# Patient Record
Sex: Male | Born: 1995 | Race: Black or African American | Hispanic: No | Marital: Single | Smoking: Never smoker
Health system: Southern US, Community
[De-identification: ages and names within clinical notes are randomized; demographics above are authoritative.]

---
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
## 2017-07-26 ENCOUNTER — Encounter (HOSPITAL_COMMUNITY): Payer: Self-pay | Admitting: Emergency Medicine

## 2017-07-26 ENCOUNTER — Emergency Department (HOSPITAL_COMMUNITY): Payer: Self-pay

## 2017-07-26 ENCOUNTER — Emergency Department (HOSPITAL_COMMUNITY)
Admission: EM | Admit: 2017-07-26 | Discharge: 2017-07-26 | Disposition: A | Discharge status: Home / Self Care | Payer: PRIVATE HEALTH INSURANCE | Attending: Emergency Medicine | Admitting: Emergency Medicine

## 2017-07-26 DIAGNOSIS — Y999 Unspecified external cause status: Secondary | ICD-10-CM | POA: Insufficient documentation

## 2017-07-26 DIAGNOSIS — Y929 Unspecified place or not applicable: Secondary | ICD-10-CM | POA: Insufficient documentation

## 2017-07-26 DIAGNOSIS — W230XXA Caught, crushed, jammed, or pinched between moving objects, initial encounter: Secondary | ICD-10-CM | POA: Insufficient documentation

## 2017-07-26 DIAGNOSIS — S93402A Sprain of unspecified ligament of left ankle, initial encounter: Secondary | ICD-10-CM

## 2017-07-26 DIAGNOSIS — Y9389 Activity, other specified: Secondary | ICD-10-CM | POA: Insufficient documentation

## 2017-07-26 DIAGNOSIS — Z79899 Other long term (current) drug therapy: Secondary | ICD-10-CM | POA: Insufficient documentation

## 2017-07-26 DIAGNOSIS — J45909 Unspecified asthma, uncomplicated: Secondary | ICD-10-CM | POA: Insufficient documentation

## 2017-07-26 MED ORDER — BACITRACIN ZINC 500 UNIT/GM EX OINT
1.0000 "application " | TOPICAL_OINTMENT | Freq: Two times a day (BID) | CUTANEOUS | Status: DC
  Administered 2017-07-26: 1 via TOPICAL
  Filled 2017-07-26: qty 0.9

## 2017-07-26 MED ORDER — NAPROXEN 375 MG PO TABS: 375.0000 mg | ORAL_TABLET | Freq: Two times a day (BID) | ORAL | 0 refills | Status: DC

## 2017-07-26 MED ORDER — ACETAMINOPHEN 325 MG PO TABS
650.0000 mg | ORAL_TABLET | Freq: Once | ORAL | Status: AC
  Administered 2017-07-26: 650 mg via ORAL
  Filled 2017-07-26: qty 2

## 2017-07-26 MED ORDER — BACITRACIN ZINC 500 UNIT/GM EX OINT: 1.0000 "application " | TOPICAL_OINTMENT | Freq: Two times a day (BID) | CUTANEOUS | 0 refills | Status: DC

## 2017-07-26 NOTE — ED Provider Notes (Signed)
[facility] EMERGENCY DEPARTMENT Provider Note   CSN: [id number] Arrival date & time: 07/26/17  4098     History   Chief Complaint Chief Complaint  Patient presents with  . Ankle Injury    HPI Barry Brown is a 21 y.o. male.  Barry Brown is a 21 y.o. Male who presents to the ED complaining of left ankle pain after injury prior to arrival.  Patient reports he was working on a car when it went out of gear and ran over his left ankle.  He complains of pain to the medial and lateral aspect of his left ankle. He reports the tire rolled over his ankle. Only one tire went over his ankle. Concrete beneath. No treatments prior to arrival. He denies numbness, tingling or weakness. No left foot, hip, knee or shin pain.  He denies other injury.    The history is provided by the patient, medical records and a parent. No language interpreter was used.  Ankle Injury     Past Medical History:  Diagnosis Date  . Asthma     There are no active problems to display for this patient.   History reviewed. No pertinent surgical history.     Home Medications    Prior to Admission medications   Medication Sig Start Date End Date Taking? Authorizing Provider  albuterol (PROVENTIL HFA;VENTOLIN HFA) 108 (90 BASE) MCG/ACT inhaler Inhale 2 puffs into the lungs every 6 (six) hours as needed. Patient used his inhaler on last night.    [provider]  aspirin-sod bicarb-citric acid (ALKA-SELTZER) 325 MG TBEF Take 325 mg by mouth every 6 (six) hours as needed. Patient used this medication for sinus and congestion.    [provider]  bacitracin ointment Apply 1 application topically 2 (two) times daily. Apply to abrasion. 07/26/17   Everlene Farrier, PA-C  methylphenidate (CONCERTA) 36 MG CR tablet Take 36 mg by mouth every morning.    [provider]  montelukast (SINGULAIR) 10 MG tablet Take 10 mg by mouth at bedtime.    [provider]    Multiple Vitamin (MULTIVITAMIN) tablet Take 1 tablet by mouth daily.    [provider]  naproxen (NAPROSYN) 375 MG tablet Take 1 tablet (375 mg total) by mouth 2 (two) times daily with a meal. 07/26/17   Everlene Farrier, PA-C    Family History No family history on file.  Social History Social History   Tobacco Use  . Smoking status: Never Smoker  Substance Use Topics  . Alcohol use: No  . Drug use: Not on file     Allergies   Sulfa antibiotics   Review of Systems Review of Systems  Constitutional: Negative for fever.  Musculoskeletal: Positive for arthralgias. Negative for joint swelling.  Skin: Positive for wound. Negative for color change.  Neurological: Negative for weakness and numbness.     Physical Exam Updated Vital Signs BP (!) 158/92 (BP Location: Right Arm)   Pulse 79   Temp 98.7 F (37.1 C) (Oral)   Resp 18   SpO2 98%   Physical Exam  Constitutional: He appears well-developed and well-nourished. No distress.  HENT:  Head: Normocephalic and atraumatic.  Eyes: Right eye exhibits no discharge. Left eye exhibits no discharge.  Cardiovascular: Normal rate, regular rhythm and intact distal pulses.  Bilateral dorsalis pedis and posterior tibialis pulses are intact.  Pulmonary/Chest: Effort normal. No respiratory distress.  Musculoskeletal: Normal range of motion. He exhibits tenderness. He exhibits no edema  or deformity.  Tenderness to palpation to the medial and lateral aspects of his left ankle.  Superficial less than 1 cm sized abrasion to the lateral aspect of his left ankle.  No ankle edema or laxity noted.  No tenderness noted to his left foot.  Leg compartments feel soft.  No tenderness to his left knee, hip or foot.  Neurological: He is alert. No sensory deficit. Coordination normal.  Skin: Skin is warm and dry. Capillary refill takes less than 2 seconds. No rash noted. He is not diaphoretic. No erythema. No pallor.  Psychiatric: He has a  normal mood and affect. His behavior is normal.  Nursing note and vitals reviewed.    ED Treatments / Results  Labs (all labs ordered are listed, but only abnormal results are displayed) Labs Reviewed - No data to display  EKG  EKG Interpretation None       Radiology Dg Ankle Complete Left  Result Date: 07/26/2017 CLINICAL DATA:  Injury to the left ankle EXAM: LEFT ANKLE COMPLETE - 3+ VIEW COMPARISON:  None. FINDINGS: There is no evidence of fracture, dislocation, or joint effusion. There is no evidence of arthropathy or other focal bone abnormality. Soft tissues are unremarkable. IMPRESSION: Negative. Electronically Signed   By: Jasmine PangKim  Fujinaga M.D.   On: 07/26/2017 19:50    Procedures Procedures (including critical care time)  Medications Ordered in ED Medications  bacitracin ointment 1 application (not administered)  acetaminophen (TYLENOL) tablet 650 mg (not administered)     Initial Impression / Assessment and Plan / ED Course  I have reviewed the triage vital signs and the nursing notes.  Pertinent labs & imaging results that were available during my care of the patient were reviewed by me and considered in my medical decision making (see chart for details).    This is a 21 y.o. Male who presents to the ED complaining of left ankle pain after injury prior to arrival.  Patient reports he was working on a car when it went out of gear and ran over his left ankle.  He complains of pain to the medial and lateral aspect of his left ankle. He reports the tire rolled over his ankle. Only one tire went over his ankle. Concrete beneath. No treatments prior to arrival. He denies numbness, tingling or weakness. No left foot, hip, knee or shin pain. On exam the patient is afebrile and nontoxic-appearing.  He has tenderness to the medial and lateral aspect of his left ankle.  No ankle instability noted.  He is neurovascularly intact.  Leg compartments feel soft.  No other tenderness to  palpation.  No deformity.  X-ray is unremarkable.  We will place the patient in an ASO ankle brace after bacitracin and wound care applied.  I advised to have non-weight-bear until he is feeling better.  I advised if he has persistent pain he needs follow-up with orthopedic surgery.  Patient provided Driscilla GrammesOrth O follow-up.  I discussed using rest, ice and elevation for his pain.  Naproxen for pain control.  Return precautions discussed. I advised the patient to follow-up with their primary care provider this week. I advised the patient to return to the emergency department with new or worsening symptoms or new concerns. The patient verbalized understanding and agreement with plan.      Final Clinical Impressions(s) / ED Diagnoses   Final diagnoses:  Sprain of left ankle, unspecified ligament, initial encounter    ED Discharge Orders  Ordered    naproxen (NAPROSYN) 375 MG tablet  2 times daily with meals     07/26/17 2047    bacitracin ointment  2 times daily     07/26/17 2047       Everlene FarrierDansie, Mathew Storck, PA-C 07/26/17 2104    Derwood KaplanNanavati, Ankit, MD 07/27/17 2320
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
## 2017-10-14 ENCOUNTER — Ambulatory Visit (HOSPITAL_COMMUNITY)
Admission: EM | Admit: 2017-10-14 | Discharge: 2017-10-14 | Disposition: A | Discharge status: Home / Self Care | Payer: BC Managed Care – PPO | Attending: Family Medicine | Admitting: Family Medicine

## 2017-10-14 ENCOUNTER — Ambulatory Visit (INDEPENDENT_AMBULATORY_CARE_PROVIDER_SITE_OTHER): Payer: BC Managed Care – PPO

## 2017-10-14 ENCOUNTER — Encounter (HOSPITAL_COMMUNITY): Payer: Self-pay | Admitting: Family Medicine

## 2017-10-14 DIAGNOSIS — S93412A Sprain of calcaneofibular ligament of left ankle, initial encounter: Secondary | ICD-10-CM | POA: Diagnosis not present

## 2017-10-14 NOTE — Discharge Instructions (Signed)
Do the stretches and exercises for a total of 3 sets, 3 times per week.   Ice/cold pack over area for 10-15 min twice daily.  Ibuprofen 400-600 mg (2-3 over the counter strength tabs) every 6 hours as needed for pain.  OK to take Tylenol 1000 mg (2 extra strength tabs) or 975 mg (3 regular strength tabs) every 6 hours as needed.

## 2017-10-14 NOTE — ED Provider Notes (Signed)
  [facility]    CSN: [id number] Arrival date & time: 10/14/17  1724  Musculoskeletal Exam  Patient: Barry Brown DOB: 05/05/1996  DOS: 10/14/2017  SUBJECTIVE:  Chief Complaint:   Chief Complaint  Patient presents with  . Ankle Injury    Barry Brown is a 22 y.o.  male for evaluation and treatment of L ankle pain.   Onset:  1 day ago.  Inverted ankle yesterday while working on car.  Location: Lateral ankle Character:  aching, sharp and stabbing  Progression of issue:  is unchanged Associated symptoms: swelling Treatment: to date has been wearing a brace at home.   Neurovascular symptoms: no  ROS: Musculoskeletal/Extremities: +L ankle pain  Past Medical History:  Diagnosis Date  . Asthma     Objective: VITAL SIGNS: BP 138/78   Pulse 77   Temp 98.1 F (36.7 C)   Resp 18   SpO2 99%  Constitutional: Well formed, well developed. No acute distress. Cardiovascular: Brisk cap refill Thorax & Lungs: No accessory muscle use Musculoskeletal: L ankle.   Normal active range of motion: no.   Normal passive range of motion: no, limited by pain Tenderness to palpation: yes, over base of 5th MT and posterior portion of lat malleolus Deformity: swelling noted postero-laterally Ecchymosis: no Tests positive: none Tests negative: squeeze, anterior drawer Neurologic: Normal sensory function. Psychiatric: Normal mood. Age appropriate judgment and insight. Alert & oriented x 3.    Assessment:  Sprain of calcaneofibular ligament of left ankle, initial encounter  Plan: XR neg.  Offered crutches but he declined. Ice, NSAIDs, Tylenol, stretches/exercises. Letter for work given stating he can return tomorrow. F/u w pcp later this week if no improvement. The patient voiced understanding and agreement to the plan.    Sharlene DoryWendling, Nicholas Paul, DO 10/14/17 1850

## 2017-10-14 NOTE — ED Triage Notes (Signed)
Pt here for left ankle pain after twisting it yesterday. Ankle brace in place.

## 2018-10-04 ENCOUNTER — Encounter: Payer: Self-pay | Admitting: Podiatry

## 2018-10-08 ENCOUNTER — Ambulatory Visit: Payer: Self-pay | Admitting: Podiatry

## 2018-11-24 IMAGING — DX DG ANKLE COMPLETE 3+V*L*
3 series · 3 of 3 positions shown · non-contrast
Comparison: None.

CLINICAL DATA: Injury to the left ankle

EXAM:
LEFT ANKLE COMPLETE - 3+ VIEW

[ankle ap]
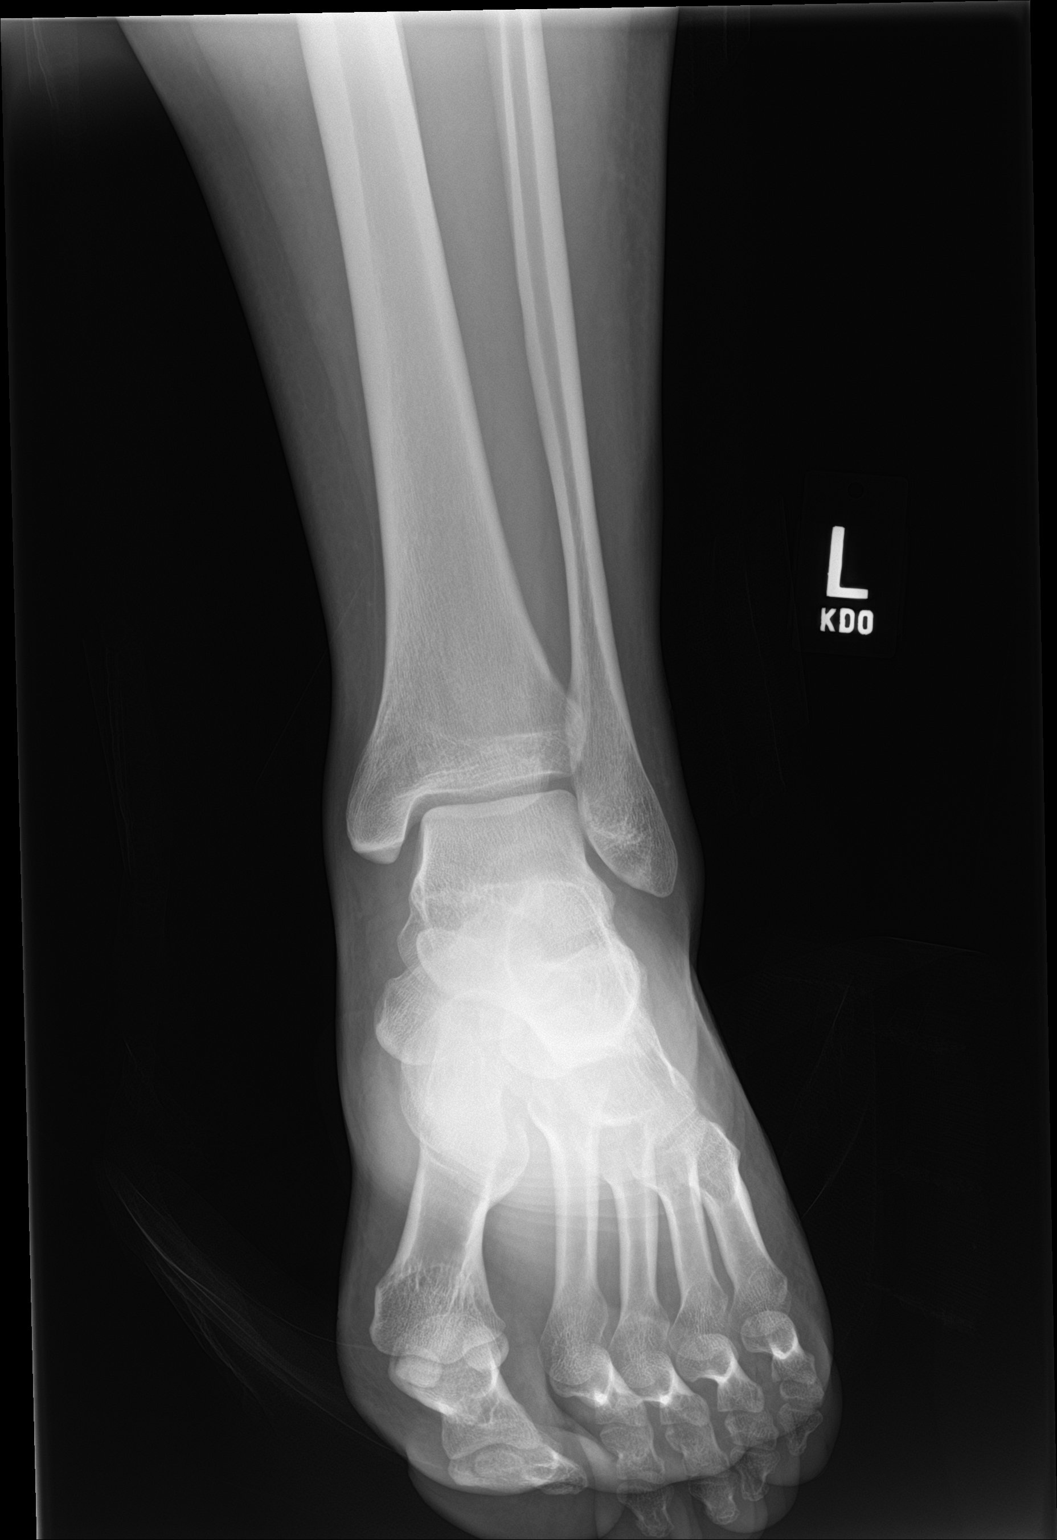

[ankle obl]
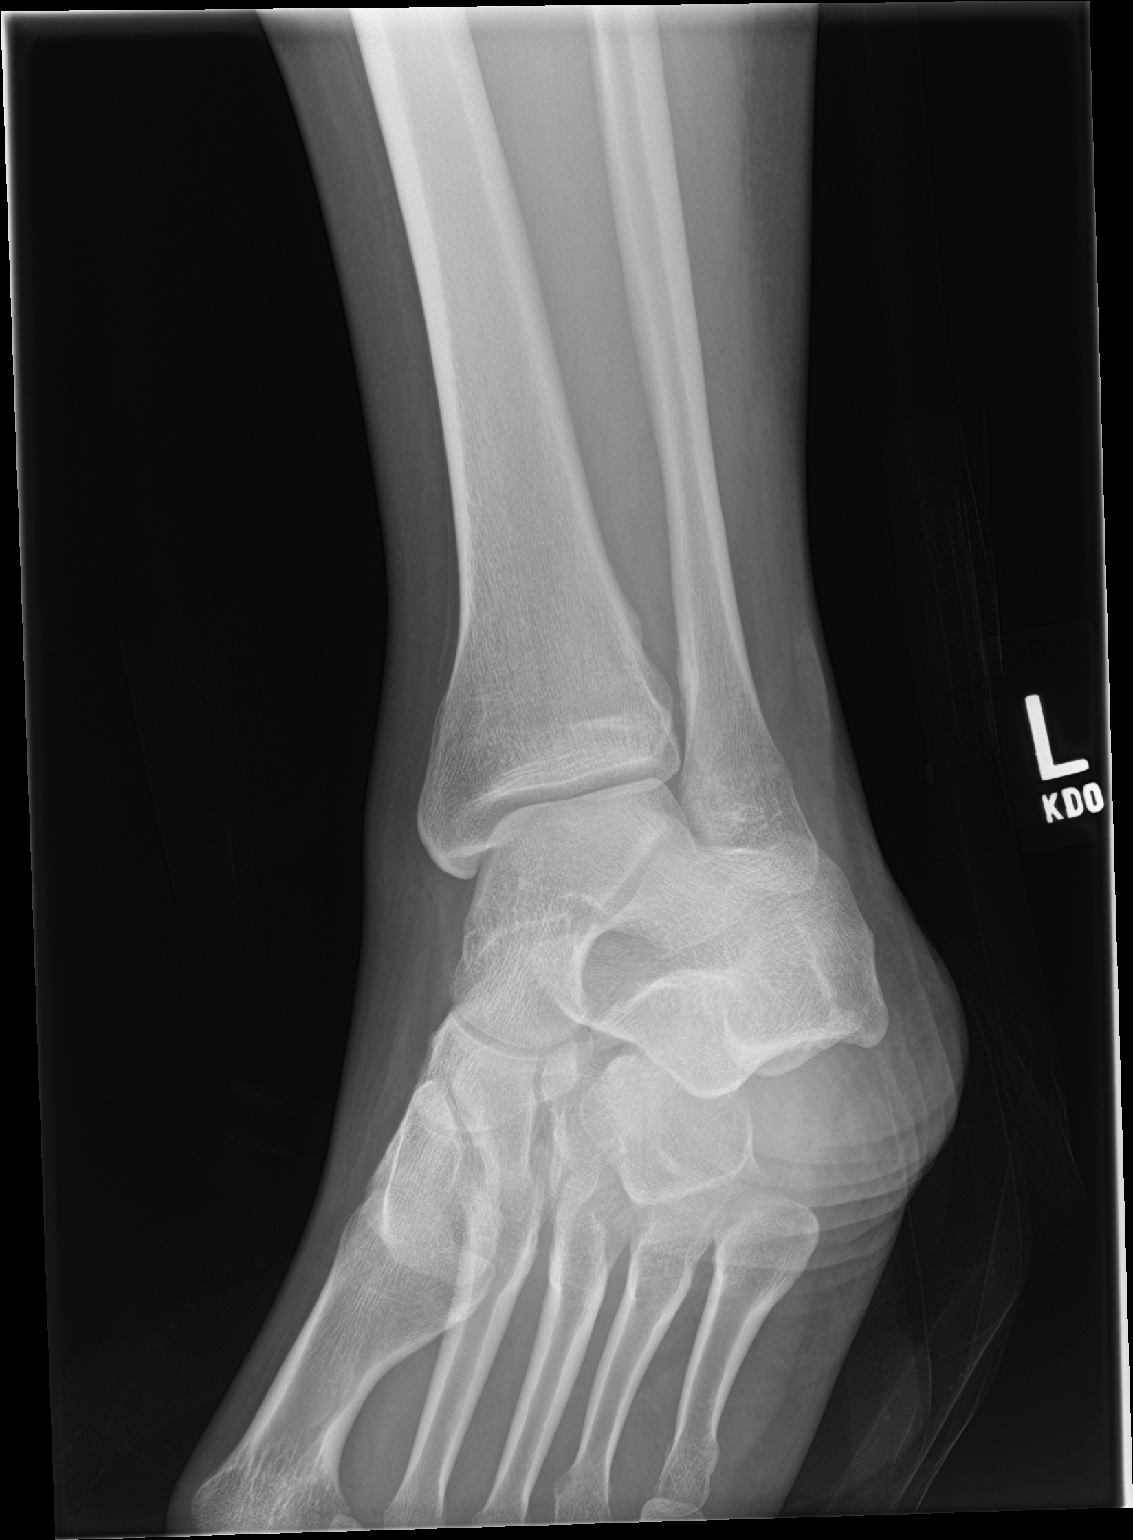

[ankle lat]
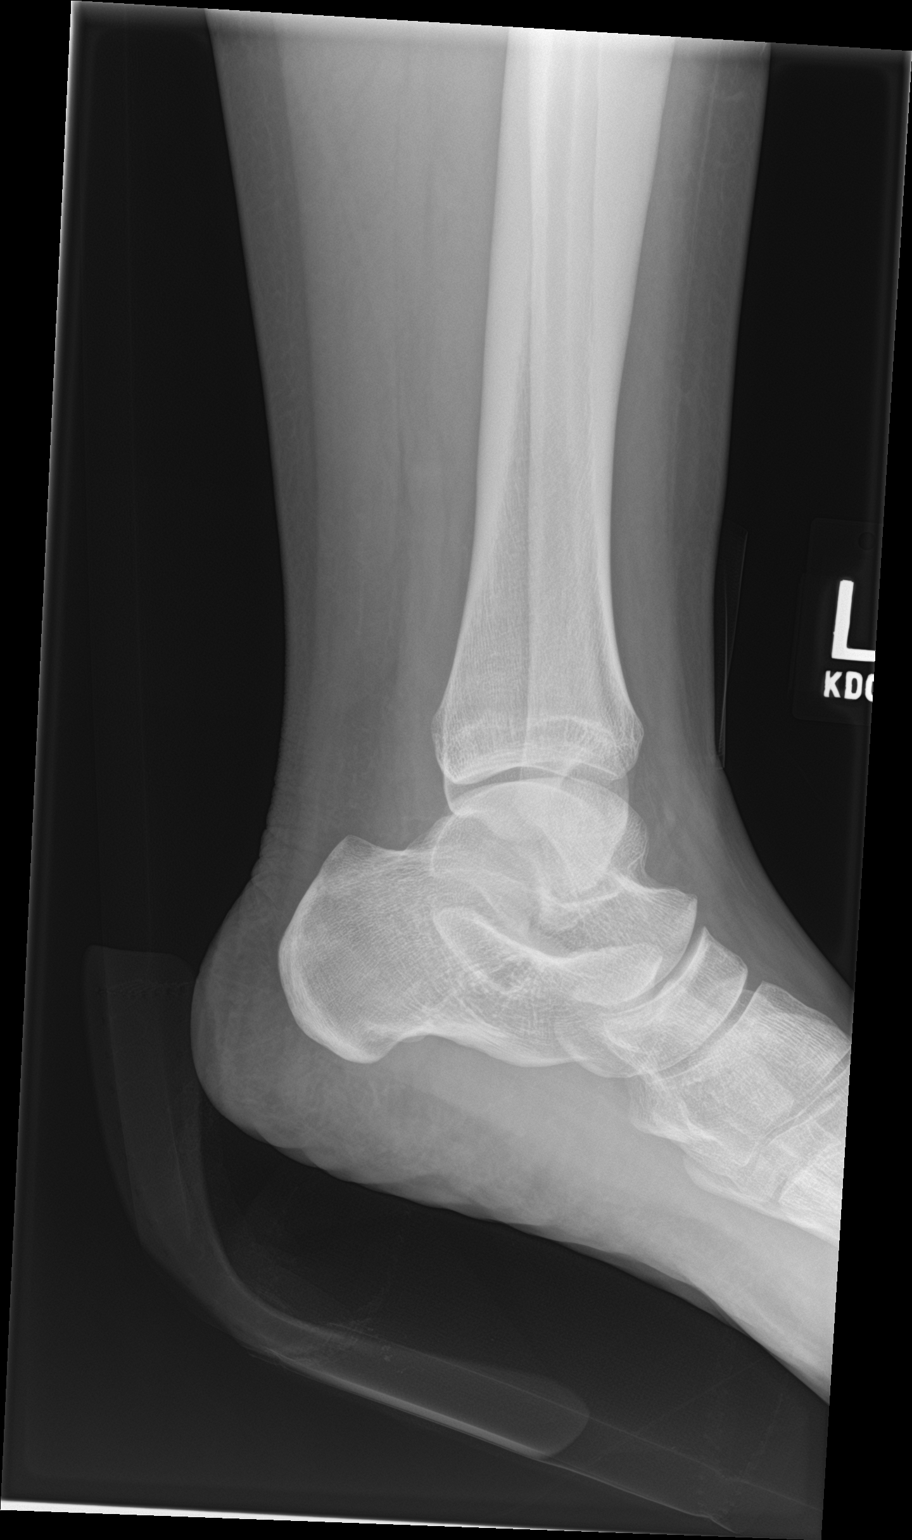

[3 of 3 positions shown; findings below may reference images not displayed]

FINDINGS: There is no evidence of fracture, dislocation, or joint effusion.
There is no evidence of arthropathy or other focal bone abnormality.
Soft tissues are unremarkable.
IMPRESSION: Negative.

## 2022-07-08 DIAGNOSIS — Z419 Encounter for procedure for purposes other than remedying health state, unspecified: Secondary | ICD-10-CM | POA: Diagnosis not present

## 2022-08-08 DIAGNOSIS — Z419 Encounter for procedure for purposes other than remedying health state, unspecified: Secondary | ICD-10-CM | POA: Diagnosis not present

## 2022-09-08 DIAGNOSIS — Z419 Encounter for procedure for purposes other than remedying health state, unspecified: Secondary | ICD-10-CM | POA: Diagnosis not present

## 2022-10-07 DIAGNOSIS — Z419 Encounter for procedure for purposes other than remedying health state, unspecified: Secondary | ICD-10-CM | POA: Diagnosis not present

## 2022-11-07 DIAGNOSIS — Z419 Encounter for procedure for purposes other than remedying health state, unspecified: Secondary | ICD-10-CM | POA: Diagnosis not present

## 2022-12-07 DIAGNOSIS — Z419 Encounter for procedure for purposes other than remedying health state, unspecified: Secondary | ICD-10-CM | POA: Diagnosis not present

## 2023-01-07 DIAGNOSIS — Z419 Encounter for procedure for purposes other than remedying health state, unspecified: Secondary | ICD-10-CM | POA: Diagnosis not present

## 2023-02-06 DIAGNOSIS — Z419 Encounter for procedure for purposes other than remedying health state, unspecified: Secondary | ICD-10-CM | POA: Diagnosis not present

## 2023-03-09 DIAGNOSIS — Z419 Encounter for procedure for purposes other than remedying health state, unspecified: Secondary | ICD-10-CM | POA: Diagnosis not present

## 2023-04-09 DIAGNOSIS — Z419 Encounter for procedure for purposes other than remedying health state, unspecified: Secondary | ICD-10-CM | POA: Diagnosis not present

## 2023-05-09 DIAGNOSIS — Z419 Encounter for procedure for purposes other than remedying health state, unspecified: Secondary | ICD-10-CM | POA: Diagnosis not present

## 2023-06-09 DIAGNOSIS — Z419 Encounter for procedure for purposes other than remedying health state, unspecified: Secondary | ICD-10-CM | POA: Diagnosis not present

## 2023-07-09 DIAGNOSIS — Z419 Encounter for procedure for purposes other than remedying health state, unspecified: Secondary | ICD-10-CM | POA: Diagnosis not present

## 2023-08-09 DIAGNOSIS — Z419 Encounter for procedure for purposes other than remedying health state, unspecified: Secondary | ICD-10-CM | POA: Diagnosis not present

## 2023-09-09 DIAGNOSIS — Z419 Encounter for procedure for purposes other than remedying health state, unspecified: Secondary | ICD-10-CM | POA: Diagnosis not present

## 2023-09-25 NOTE — Progress Notes (Deleted)
   New Patient Office Visit  Subjective    Patient ID: Barry Brown, male    DOB: [birth year]  Age: 28 y.o. MRN: [id number]  CC: No chief complaint on file.   HPI Barry Brown presents to establish care ***  Outpatient Encounter Medications as of 09/26/2023  Medication Sig  . albuterol (PROVENTIL HFA;VENTOLIN HFA) 108 (90 BASE) MCG/ACT inhaler Inhale 2 puffs into the lungs every 6 (six) hours as needed. Patient used his inhaler on last night.  Marland Kitchen aspirin-sod bicarb-citric acid (ALKA-SELTZER) 325 MG TBEF Take 325 mg by mouth every 6 (six) hours as needed. Patient used this medication for sinus and congestion.  . methylphenidate (CONCERTA) 36 MG CR tablet Take 36 mg by mouth every morning.  . montelukast (SINGULAIR) 10 MG tablet Take 10 mg by mouth at bedtime.  . Multiple Vitamin (MULTIVITAMIN) tablet Take 1 tablet by mouth daily.   No facility-administered encounter medications on file as of 09/26/2023.    Past Medical History:  Diagnosis Date  . Asthma     No past surgical history on file.  No family history on file.  Social History   Socioeconomic History  . Marital status: Single    Spouse name: Not on file  . Number of children: Not on file  . Years of education: Not on file  . Highest education level: Not on file  Occupational History  . Not on file  Tobacco Use  . Smoking status: Never  . Smokeless tobacco: Not on file  Substance and Sexual Activity  . Alcohol use: No  . Drug use: Not on file  . Sexual activity: Not on file  Other Topics Concern  . Not on file  Social History Narrative  . Not on file   Social Drivers of Health   Financial Resource Strain: Not on file  Food Insecurity: Not on file  Transportation Needs: Not on file  Physical Activity: Not on file  Stress: Not on file  Social Connections: Not on file  Intimate Partner Violence: Not on file    ROS Per HPI      Objective    There were no vitals taken for this visit.  Physical  Exam Vitals and nursing note reviewed.  Constitutional:      Appearance: Normal appearance.  HENT:     Head: Normocephalic and atraumatic.  Eyes:     Extraocular Movements: Extraocular movements intact.  Cardiovascular:     Rate and Rhythm: Normal rate and regular rhythm.     Pulses: Normal pulses.     Heart sounds: Normal heart sounds.  Pulmonary:     Effort: Pulmonary effort is normal.     Breath sounds: Normal breath sounds.  Musculoskeletal:        General: Normal range of motion.     Cervical back: Normal range of motion.  Skin:    General: Skin is warm and dry.  Neurological:     General: No focal deficit present.     Mental Status: He is alert and oriented to person, place, and time.  Psychiatric:        Mood and Affect: Mood normal.        Behavior: Behavior normal.       Assessment & Plan:   There are no diagnoses linked to this encounter.   No follow-ups on file.   Moshe Cipro, FNP

## 2023-09-26 ENCOUNTER — Ambulatory Visit: Payer: Medicaid Other | Admitting: Family Medicine

## 2023-10-07 DIAGNOSIS — Z419 Encounter for procedure for purposes other than remedying health state, unspecified: Secondary | ICD-10-CM | POA: Diagnosis not present

## 2023-11-18 DIAGNOSIS — Z419 Encounter for procedure for purposes other than remedying health state, unspecified: Secondary | ICD-10-CM | POA: Diagnosis not present

## 2023-12-18 DIAGNOSIS — Z419 Encounter for procedure for purposes other than remedying health state, unspecified: Secondary | ICD-10-CM | POA: Diagnosis not present

## 2024-01-18 DIAGNOSIS — Z419 Encounter for procedure for purposes other than remedying health state, unspecified: Secondary | ICD-10-CM | POA: Diagnosis not present

## 2024-02-17 DIAGNOSIS — Z419 Encounter for procedure for purposes other than remedying health state, unspecified: Secondary | ICD-10-CM | POA: Diagnosis not present

## 2024-03-19 DIAGNOSIS — Z419 Encounter for procedure for purposes other than remedying health state, unspecified: Secondary | ICD-10-CM | POA: Diagnosis not present

## 2024-04-19 DIAGNOSIS — Z419 Encounter for procedure for purposes other than remedying health state, unspecified: Secondary | ICD-10-CM | POA: Diagnosis not present

## 2024-06-01 DIAGNOSIS — K2100 Gastro-esophageal reflux disease with esophagitis, without bleeding: Secondary | ICD-10-CM | POA: Diagnosis not present

## 2024-06-01 DIAGNOSIS — A084 Viral intestinal infection, unspecified: Secondary | ICD-10-CM | POA: Diagnosis not present

## 2024-06-01 DIAGNOSIS — R112 Nausea with vomiting, unspecified: Secondary | ICD-10-CM | POA: Diagnosis not present
